# Patient Record
Sex: Female | Born: 1960 | Race: White | Hispanic: No | Marital: Married
Health system: Southern US, Community
[De-identification: ages and names within clinical notes are randomized; demographics above are authoritative.]

## PROBLEM LIST (undated history)

## (undated) HISTORY — PX: BREAST EXCISIONAL BIOPSY: SUR124

---

## 2005-09-27 ENCOUNTER — Encounter: Admission: RE | Admit: 2005-09-27 | Discharge: 2005-09-27 | Payer: Self-pay | Admitting: Obstetrics and Gynecology

## 2006-07-29 ENCOUNTER — Encounter: Admission: RE | Admit: 2006-07-29 | Discharge: 2006-07-29 | Payer: Self-pay | Admitting: Internal Medicine

## 2006-10-29 ENCOUNTER — Encounter: Admission: RE | Admit: 2006-10-29 | Discharge: 2006-10-29 | Payer: Self-pay | Admitting: Obstetrics and Gynecology

## 2007-12-07 ENCOUNTER — Ambulatory Visit: Payer: Self-pay | Admitting: Pulmonary Disease

## 2007-12-07 DIAGNOSIS — I10 Essential (primary) hypertension: Secondary | ICD-10-CM | POA: Insufficient documentation

## 2007-12-07 DIAGNOSIS — J45909 Unspecified asthma, uncomplicated: Secondary | ICD-10-CM | POA: Insufficient documentation

## 2007-12-07 DIAGNOSIS — K219 Gastro-esophageal reflux disease without esophagitis: Secondary | ICD-10-CM | POA: Insufficient documentation

## 2007-12-07 DIAGNOSIS — J309 Allergic rhinitis, unspecified: Secondary | ICD-10-CM | POA: Insufficient documentation

## 2008-01-19 ENCOUNTER — Encounter: Admission: RE | Admit: 2008-01-19 | Discharge: 2008-01-19 | Payer: Self-pay | Admitting: Obstetrics and Gynecology

## 2008-04-11 ENCOUNTER — Encounter: Admission: RE | Admit: 2008-04-11 | Discharge: 2008-04-11 | Payer: Self-pay | Admitting: Obstetrics and Gynecology

## 2008-04-11 ENCOUNTER — Encounter (INDEPENDENT_AMBULATORY_CARE_PROVIDER_SITE_OTHER): Payer: Self-pay | Admitting: Diagnostic Radiology

## 2008-05-03 ENCOUNTER — Encounter: Admission: RE | Admit: 2008-05-03 | Discharge: 2008-05-03 | Payer: Self-pay | Admitting: Surgery

## 2008-05-03 ENCOUNTER — Ambulatory Visit (HOSPITAL_BASED_OUTPATIENT_CLINIC_OR_DEPARTMENT_OTHER): Admission: RE | Admit: 2008-05-03 | Discharge: 2008-05-03 | Payer: Self-pay | Admitting: Surgery

## 2008-05-03 ENCOUNTER — Encounter (INDEPENDENT_AMBULATORY_CARE_PROVIDER_SITE_OTHER): Payer: Self-pay | Admitting: Surgery

## 2010-04-03 IMAGING — MG MM DIAGNOSTIC UNILATERAL R
9 of 10 series · 9 of 10 positions shown · non-contrast
Comparison: With priors

CLINICAL DATA: Palpable mass, right breast

DIGITAL DIAGNOSTIC  RIGHT  MAMMOGRAM  ] AND RIGHT BREAST
ULTRASOUND:

[R CC (1 of 3)]
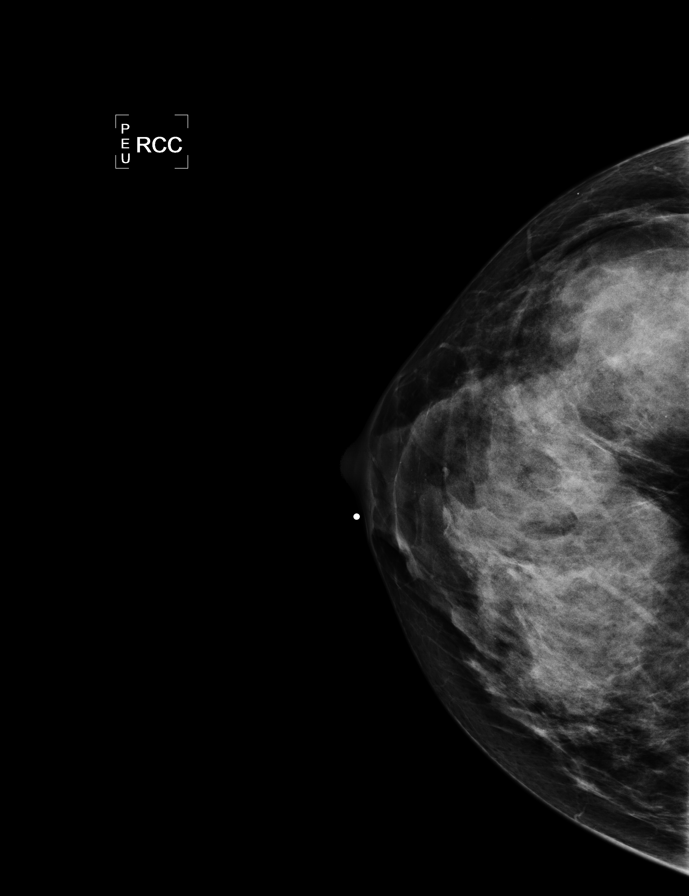

[R CC (2 of 3)]
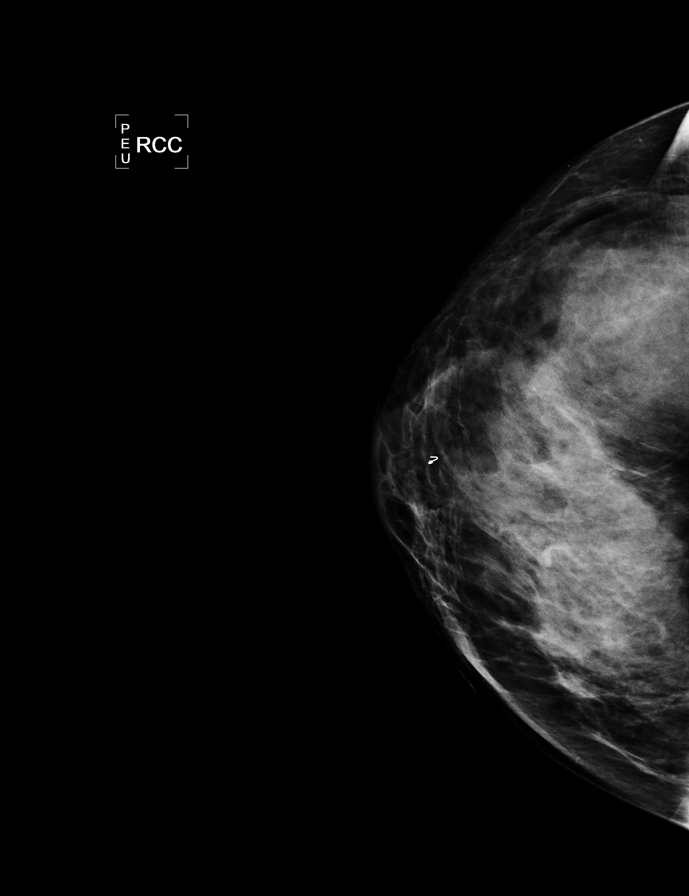

[R MLO]
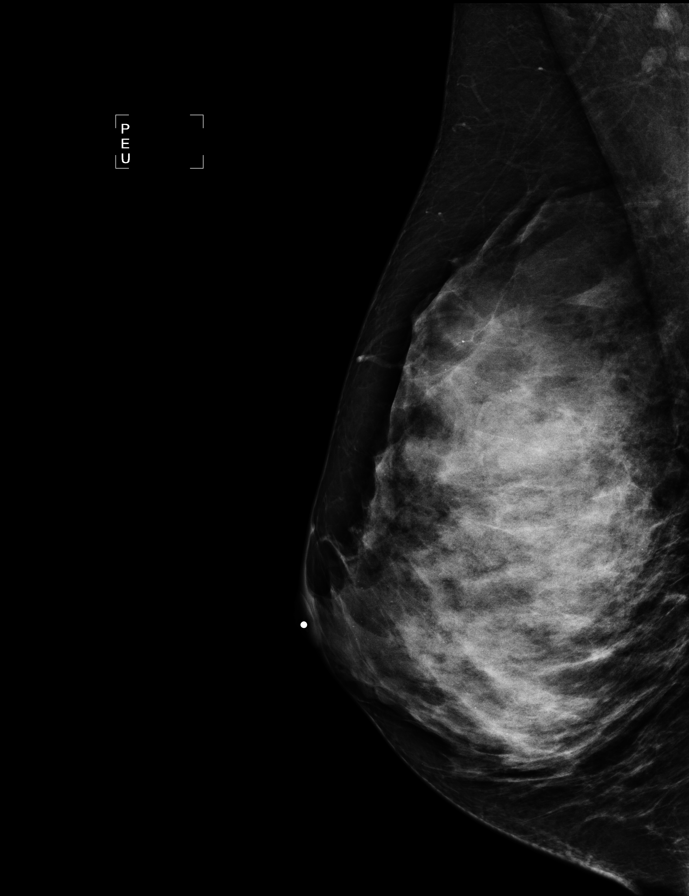

[R TAN]
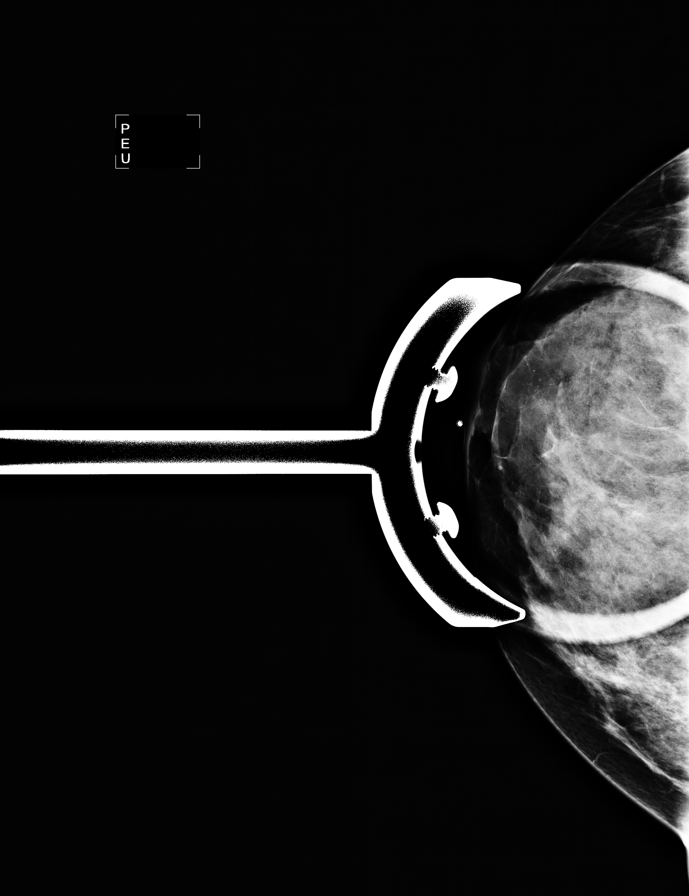

[R ML (1 of 4)]
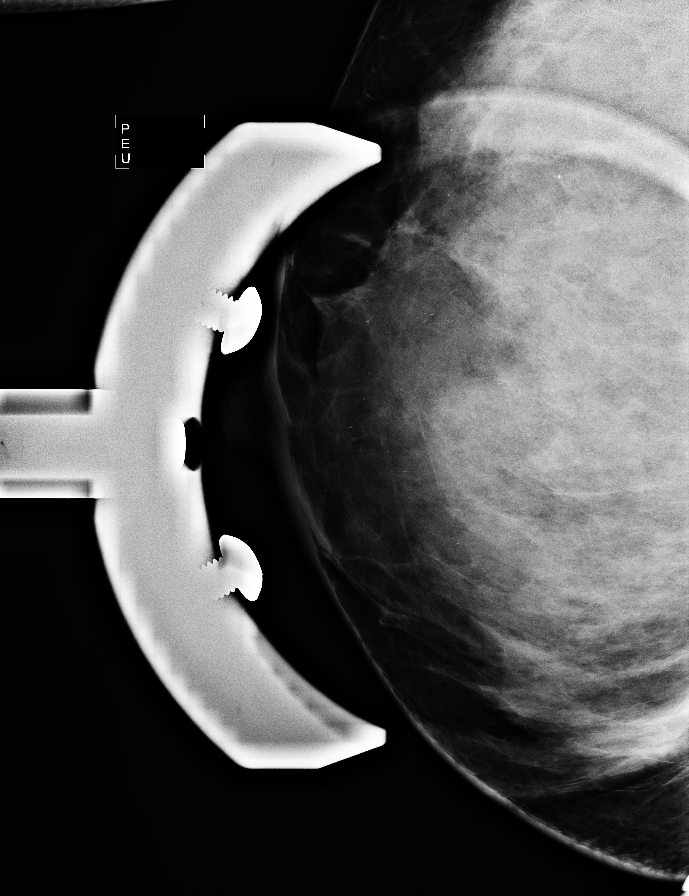

[R ML (2 of 4)]
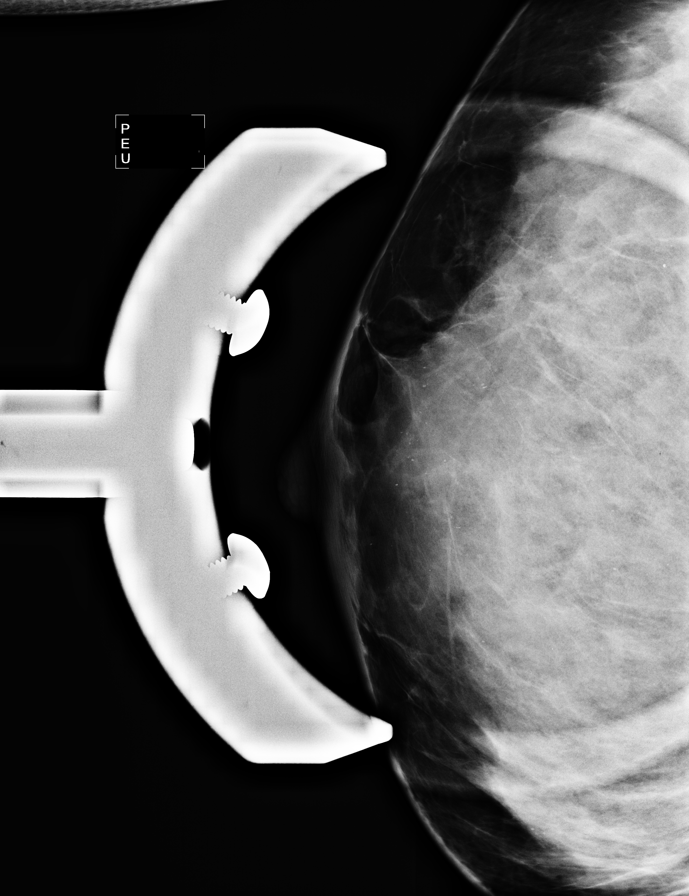

[R CC (3 of 3)]
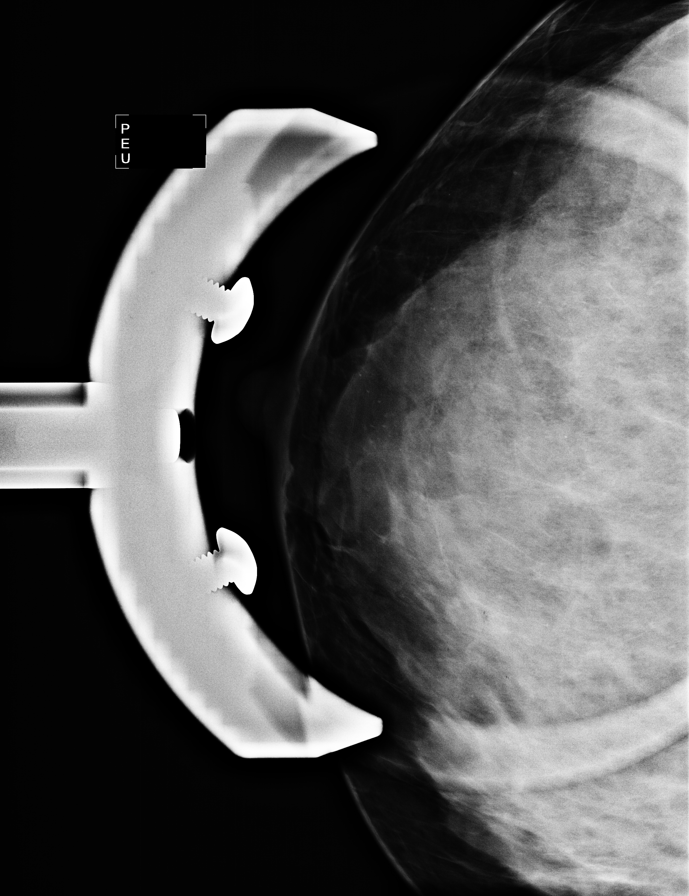

[R ML (3 of 4)]
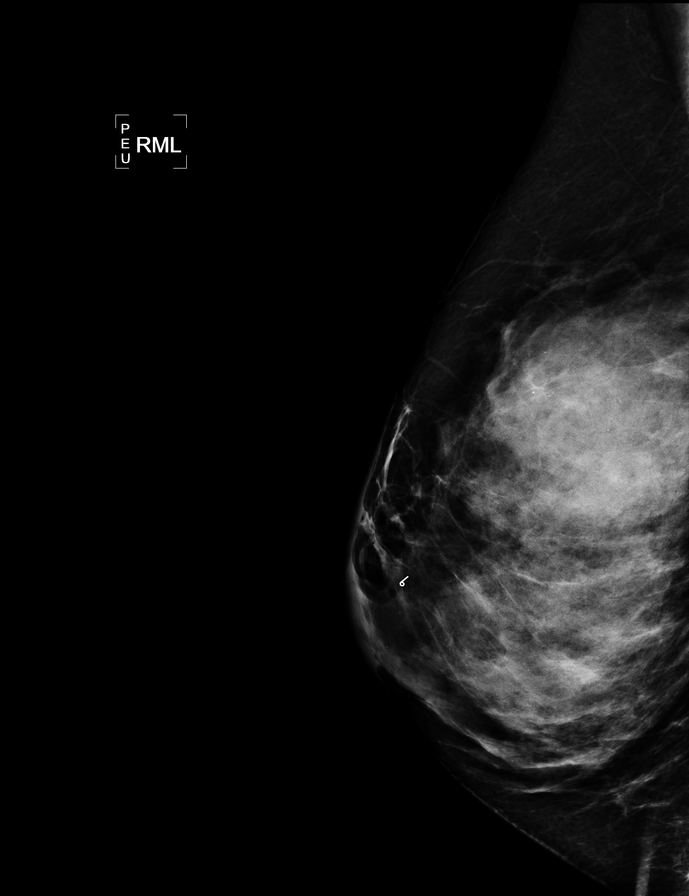

[R ML (4 of 4)]
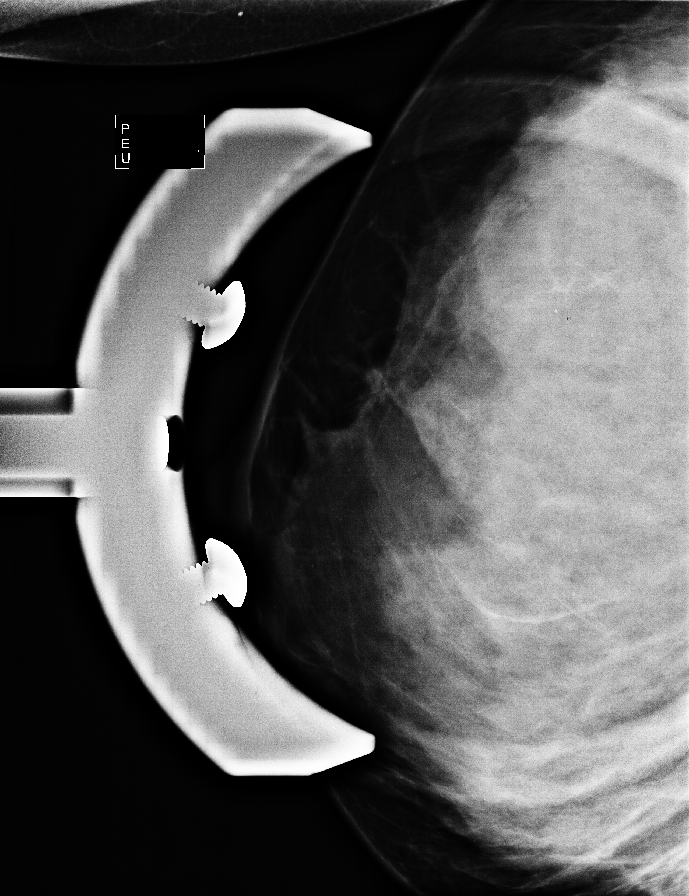

[9 of 10 positions shown; findings below may reference images not displayed]

FINDINGS: There is a dense fibroglandular pattern.  Scattered
diffuse punctate calcifications are again seen in the right breast.
Some of these calcifications are in the subareolar region and have
a differential appearance.  There is no mass seen mammographically.

On physical exam, I palpate a discrete mass in the right breast at
12 o'clock in the subareolar location.

Ultrasound is performed, showing there is a hypoechoic mass with
macrolobulated borders and what appears to be some extension into
ducts measuring 1.7 x 1.1 x 1.5 cm.  Sonographic evaluation of the
right axilla fails to reveal any enlarged adenopathy.
IMPRESSION: Suspicious mass, right breast.  Tissue sampling is recommended.  An
ultrasound-guided core biopsy will be performed and dictated
separately. Findings were discussed with the patient called to Dr.
[REDACTED].

BI-RADS CATEGORY 4:  Suspicious abnormality - biopsy should be
considered.

## 2010-04-17 ENCOUNTER — Encounter: Admission: RE | Admit: 2010-04-17 | Discharge: 2010-04-17 | Payer: Self-pay | Admitting: Obstetrics and Gynecology

## 2010-10-30 NOTE — Op Note (Signed)
NAME:  Alyssa Jennings, Alyssa Jennings             ACCOUNT NO.:  [id number]   MEDICAL RECORD NO.:  [id number]          PATIENT TYPE:  AMB   LOCATION:  [facility]SC                          FACILITY:  [facility]   PHYSICIAN:  Thomas A. Cornett, M.D.DATE OF BIRTH:  04/06/1961   DATE OF PROCEDURE:  05/03/2008  DATE OF DISCHARGE:                               OPERATIVE REPORT   PREOPERATIVE DIAGNOSIS:  Right breast papilloma.   POSTOPERATIVE DIAGNOSIS:  Right breast papilloma.   PROCEDURE:  Right breast needle-localized excisional biopsy.   SURGEON:  Maisie Fus A. Cornett, M.D.   ANESTHESIA:  LMA 0.25% Sensorcaine local.   ESTIMATED BLOOD LOSS:  20 mL.   SPECIMENS:  Right breast tissue with localizing wire and clip, see  pathology.   INDICATIONS FOR PROCEDURE:  The patient is a 50 year old female, found  to have an abnormality noted on recent mammography.  Core biopsy showed  a papilloma, but excision is recommended for definitive diagnosis.  She  presents today for needle-localized biopsy of this area.   DESCRIPTION OF PROCEDURE:  The patient was brought to the operating room  after undergoing right breast needle localization at the [facility]  of [place].  After induction of LMA anesthesia, the right breast was  prepped and draped in a sterile fashion.  Anesthesia was made from about  12 to 2 o'clock along the nipple-areolar complex on the right breast.  The wire exited here.  All tissue around the tip of the wire was  excised.  Radiograph revealed the wire to be intact along with the clip  to be present.  We irrigated the wound.  We achieved hemostasis with  cautery and then closed the wound in layers with a deep layer of 3-0  Vicryl and a subsequent 4-0 Monocryl stitch.  Dermabond was applied as  dressing.  All final counts of sponge, needle, and instruments were  found be correct at this portion of the case.  The patient was awoke and  taken to recovery in satisfactory condition.      Thomas A.  Cornett, M.D.  Electronically Signed     TAC/MEDQ  D:  05/03/2008  T:  05/04/2008  Job:  [id number]

## 2011-03-19 ENCOUNTER — Other Ambulatory Visit: Payer: Self-pay | Admitting: Obstetrics and Gynecology

## 2011-03-19 DIAGNOSIS — Z1231 Encounter for screening mammogram for malignant neoplasm of breast: Secondary | ICD-10-CM

## 2011-03-19 LAB — CBC
HCT: 41.1
MCV: 93.2
RBC: 4.41
WBC: 8.5

## 2011-03-19 LAB — DIFFERENTIAL
Eosinophils Absolute: 0.2
Eosinophils Relative: 3
Lymphocytes Relative: 24
Lymphs Abs: 2.1
Monocytes Relative: 5

## 2011-03-19 LAB — BASIC METABOLIC PANEL
Chloride: 106
GFR calc Af Amer: >60
GFR calc non Af Amer: >60
Potassium: 4.7
Sodium: 141

## 2011-04-30 ENCOUNTER — Ambulatory Visit: Payer: Self-pay

## 2011-05-14 ENCOUNTER — Ambulatory Visit
Admission: RE | Admit: 2011-05-14 | Discharge: 2011-05-14 | Disposition: A | Discharge status: Home / Self Care | Payer: Managed Care, Other (non HMO) | Source: Ambulatory Visit | Attending: Obstetrics and Gynecology | Admitting: Obstetrics and Gynecology

## 2011-05-14 DIAGNOSIS — Z1231 Encounter for screening mammogram for malignant neoplasm of breast: Secondary | ICD-10-CM

## 2011-11-01 ENCOUNTER — Other Ambulatory Visit: Payer: Self-pay | Admitting: Dermatology

## 2012-04-24 ENCOUNTER — Other Ambulatory Visit: Payer: Self-pay | Admitting: Obstetrics and Gynecology

## 2012-04-24 DIAGNOSIS — Z1231 Encounter for screening mammogram for malignant neoplasm of breast: Secondary | ICD-10-CM

## 2012-06-03 ENCOUNTER — Ambulatory Visit
Admission: RE | Admit: 2012-06-03 | Discharge: 2012-06-03 | Disposition: A | Discharge status: Home / Self Care | Payer: Managed Care, Other (non HMO) | Source: Ambulatory Visit | Attending: Obstetrics and Gynecology | Admitting: Obstetrics and Gynecology

## 2012-06-03 DIAGNOSIS — Z1231 Encounter for screening mammogram for malignant neoplasm of breast: Secondary | ICD-10-CM

## 2012-09-14 ENCOUNTER — Encounter: Payer: Self-pay | Admitting: Internal Medicine

## 2012-11-13 ENCOUNTER — Encounter: Payer: Managed Care, Other (non HMO) | Admitting: Internal Medicine

## 2013-03-04 ENCOUNTER — Other Ambulatory Visit: Payer: Self-pay | Admitting: Dermatology

## 2013-04-13 ENCOUNTER — Ambulatory Visit
Admission: RE | Admit: 2013-04-13 | Discharge: 2013-04-13 | Disposition: A | Discharge status: Home / Self Care | Payer: Managed Care, Other (non HMO) | Source: Ambulatory Visit | Attending: Internal Medicine | Admitting: Internal Medicine

## 2013-04-13 ENCOUNTER — Other Ambulatory Visit: Payer: Self-pay | Admitting: Internal Medicine

## 2013-04-13 DIAGNOSIS — R591 Generalized enlarged lymph nodes: Secondary | ICD-10-CM

## 2013-04-13 DIAGNOSIS — R1909 Other intra-abdominal and pelvic swelling, mass and lump: Secondary | ICD-10-CM

## 2013-04-13 MED ORDER — IOHEXOL 300 MG/ML  SOLN
100.0000 mL | Freq: Once | INTRAMUSCULAR | Status: AC | PRN
  Administered 2013-04-13: 100 mL via INTRAVENOUS

## 2013-09-13 ENCOUNTER — Other Ambulatory Visit: Payer: Self-pay

## 2013-09-13 DIAGNOSIS — Z1231 Encounter for screening mammogram for malignant neoplasm of breast: Secondary | ICD-10-CM

## 2013-10-06 ENCOUNTER — Ambulatory Visit
Admission: RE | Admit: 2013-10-06 | Discharge: 2013-10-06 | Disposition: A | Discharge status: Home / Self Care | Payer: Self-pay | Source: Ambulatory Visit

## 2013-10-06 ENCOUNTER — Encounter (INDEPENDENT_AMBULATORY_CARE_PROVIDER_SITE_OTHER): Payer: Self-pay

## 2013-10-06 DIAGNOSIS — Z1231 Encounter for screening mammogram for malignant neoplasm of breast: Secondary | ICD-10-CM

## 2013-10-08 ENCOUNTER — Other Ambulatory Visit: Payer: Self-pay | Admitting: Obstetrics and Gynecology

## 2013-10-08 DIAGNOSIS — R928 Other abnormal and inconclusive findings on diagnostic imaging of breast: Secondary | ICD-10-CM

## 2013-10-20 ENCOUNTER — Ambulatory Visit
Admission: RE | Admit: 2013-10-20 | Discharge: 2013-10-20 | Disposition: A | Discharge status: Home / Self Care | Payer: Managed Care, Other (non HMO) | Source: Ambulatory Visit | Attending: Obstetrics and Gynecology | Admitting: Obstetrics and Gynecology

## 2013-10-20 DIAGNOSIS — R928 Other abnormal and inconclusive findings on diagnostic imaging of breast: Secondary | ICD-10-CM

## 2014-10-06 ENCOUNTER — Other Ambulatory Visit: Payer: Self-pay

## 2014-10-06 DIAGNOSIS — Z1231 Encounter for screening mammogram for malignant neoplasm of breast: Secondary | ICD-10-CM

## 2014-10-31 ENCOUNTER — Encounter (INDEPENDENT_AMBULATORY_CARE_PROVIDER_SITE_OTHER): Payer: Self-pay

## 2014-10-31 ENCOUNTER — Ambulatory Visit
Admission: RE | Admit: 2014-10-31 | Discharge: 2014-10-31 | Disposition: A | Discharge status: Home / Self Care | Payer: Managed Care, Other (non HMO) | Source: Ambulatory Visit

## 2014-10-31 DIAGNOSIS — Z1231 Encounter for screening mammogram for malignant neoplasm of breast: Secondary | ICD-10-CM

## 2014-12-21 ENCOUNTER — Other Ambulatory Visit: Payer: Self-pay | Admitting: Internal Medicine

## 2014-12-21 DIAGNOSIS — R319 Hematuria, unspecified: Secondary | ICD-10-CM

## 2014-12-22 ENCOUNTER — Ambulatory Visit
Admission: RE | Admit: 2014-12-22 | Discharge: 2014-12-22 | Disposition: A | Discharge status: Home / Self Care | Payer: Managed Care, Other (non HMO) | Source: Ambulatory Visit | Attending: Internal Medicine | Admitting: Internal Medicine

## 2014-12-22 DIAGNOSIS — R319 Hematuria, unspecified: Secondary | ICD-10-CM

## 2016-06-27 ENCOUNTER — Other Ambulatory Visit: Payer: Self-pay | Admitting: Obstetrics and Gynecology

## 2016-06-27 ENCOUNTER — Other Ambulatory Visit: Payer: Self-pay | Admitting: Internal Medicine

## 2016-06-27 DIAGNOSIS — Z1231 Encounter for screening mammogram for malignant neoplasm of breast: Secondary | ICD-10-CM

## 2016-06-28 ENCOUNTER — Ambulatory Visit
Admission: RE | Admit: 2016-06-28 | Discharge: 2016-06-28 | Disposition: A | Discharge status: Home / Self Care | Payer: 59 | Source: Ambulatory Visit | Attending: Internal Medicine | Admitting: Internal Medicine

## 2016-06-28 DIAGNOSIS — Z1231 Encounter for screening mammogram for malignant neoplasm of breast: Secondary | ICD-10-CM

## 2017-06-26 ENCOUNTER — Other Ambulatory Visit: Payer: Self-pay | Admitting: Internal Medicine

## 2017-06-26 DIAGNOSIS — Z1231 Encounter for screening mammogram for malignant neoplasm of breast: Secondary | ICD-10-CM

## 2017-07-16 ENCOUNTER — Ambulatory Visit
Admission: RE | Admit: 2017-07-16 | Discharge: 2017-07-16 | Disposition: A | Discharge status: Home / Self Care | Payer: 59 | Source: Ambulatory Visit | Attending: Internal Medicine | Admitting: Internal Medicine

## 2017-07-16 DIAGNOSIS — Z1231 Encounter for screening mammogram for malignant neoplasm of breast: Secondary | ICD-10-CM

## 2017-12-25 DIAGNOSIS — I10 Essential (primary) hypertension: Secondary | ICD-10-CM | POA: Diagnosis not present

## 2017-12-25 DIAGNOSIS — R82998 Other abnormal findings in urine: Secondary | ICD-10-CM | POA: Diagnosis not present

## 2017-12-25 DIAGNOSIS — Z0000 Encounter for general adult medical examination without abnormal findings: Secondary | ICD-10-CM | POA: Diagnosis not present

## 2018-01-01 DIAGNOSIS — H6123 Impacted cerumen, bilateral: Secondary | ICD-10-CM | POA: Diagnosis not present

## 2018-01-01 DIAGNOSIS — I10 Essential (primary) hypertension: Secondary | ICD-10-CM | POA: Diagnosis not present

## 2018-01-01 DIAGNOSIS — Z6827 Body mass index (BMI) 27.0-27.9, adult: Secondary | ICD-10-CM | POA: Diagnosis not present

## 2018-01-01 DIAGNOSIS — Z0000 Encounter for general adult medical examination without abnormal findings: Secondary | ICD-10-CM | POA: Diagnosis not present

## 2018-01-02 DIAGNOSIS — Z1212 Encounter for screening for malignant neoplasm of rectum: Secondary | ICD-10-CM | POA: Diagnosis not present

## 2018-02-06 DIAGNOSIS — K5229 Other allergic and dietetic gastroenteritis and colitis: Secondary | ICD-10-CM | POA: Diagnosis not present

## 2018-02-06 DIAGNOSIS — Z91018 Allergy to other foods: Secondary | ICD-10-CM | POA: Diagnosis not present

## 2018-02-06 DIAGNOSIS — J3089 Other allergic rhinitis: Secondary | ICD-10-CM | POA: Diagnosis not present

## 2018-02-06 DIAGNOSIS — K200 Eosinophilic esophagitis: Secondary | ICD-10-CM | POA: Diagnosis not present

## 2018-04-16 DIAGNOSIS — J4520 Mild intermittent asthma, uncomplicated: Secondary | ICD-10-CM | POA: Diagnosis not present

## 2018-04-16 DIAGNOSIS — J3089 Other allergic rhinitis: Secondary | ICD-10-CM | POA: Diagnosis not present

## 2018-04-16 DIAGNOSIS — J301 Allergic rhinitis due to pollen: Secondary | ICD-10-CM | POA: Diagnosis not present

## 2018-08-25 ENCOUNTER — Other Ambulatory Visit: Payer: Self-pay | Admitting: Internal Medicine

## 2018-08-25 DIAGNOSIS — Z1231 Encounter for screening mammogram for malignant neoplasm of breast: Secondary | ICD-10-CM

## 2018-09-18 ENCOUNTER — Ambulatory Visit: Payer: 59

## 2018-11-03 DIAGNOSIS — L738 Other specified follicular disorders: Secondary | ICD-10-CM | POA: Diagnosis not present

## 2018-11-03 DIAGNOSIS — Z85828 Personal history of other malignant neoplasm of skin: Secondary | ICD-10-CM | POA: Diagnosis not present

## 2018-11-03 DIAGNOSIS — L821 Other seborrheic keratosis: Secondary | ICD-10-CM | POA: Diagnosis not present

## 2018-11-03 DIAGNOSIS — D225 Melanocytic nevi of trunk: Secondary | ICD-10-CM | POA: Diagnosis not present

## 2018-11-17 DIAGNOSIS — Z20828 Contact with and (suspected) exposure to other viral communicable diseases: Secondary | ICD-10-CM | POA: Diagnosis not present

## 2018-11-17 DIAGNOSIS — U071 COVID-19: Secondary | ICD-10-CM | POA: Diagnosis not present

## 2018-11-23 ENCOUNTER — Ambulatory Visit: Payer: 59

## 2018-12-08 DIAGNOSIS — Z20828 Contact with and (suspected) exposure to other viral communicable diseases: Secondary | ICD-10-CM | POA: Diagnosis not present

## 2018-12-16 DIAGNOSIS — Z20828 Contact with and (suspected) exposure to other viral communicable diseases: Secondary | ICD-10-CM | POA: Diagnosis not present

## 2018-12-23 DIAGNOSIS — H6121 Impacted cerumen, right ear: Secondary | ICD-10-CM | POA: Diagnosis not present

## 2018-12-23 DIAGNOSIS — H6122 Impacted cerumen, left ear: Secondary | ICD-10-CM | POA: Diagnosis not present

## 2018-12-23 DIAGNOSIS — H60393 Other infective otitis externa, bilateral: Secondary | ICD-10-CM | POA: Diagnosis not present

## 2019-01-18 DIAGNOSIS — Z0000 Encounter for general adult medical examination without abnormal findings: Secondary | ICD-10-CM | POA: Diagnosis not present

## 2019-01-18 DIAGNOSIS — E7849 Other hyperlipidemia: Secondary | ICD-10-CM | POA: Diagnosis not present

## 2019-01-18 DIAGNOSIS — I10 Essential (primary) hypertension: Secondary | ICD-10-CM | POA: Diagnosis not present

## 2019-01-18 DIAGNOSIS — R82998 Other abnormal findings in urine: Secondary | ICD-10-CM | POA: Diagnosis not present

## 2019-01-25 DIAGNOSIS — K200 Eosinophilic esophagitis: Secondary | ICD-10-CM | POA: Diagnosis not present

## 2019-01-25 DIAGNOSIS — Z20818 Contact with and (suspected) exposure to other bacterial communicable diseases: Secondary | ICD-10-CM | POA: Diagnosis not present

## 2019-01-25 DIAGNOSIS — E785 Hyperlipidemia, unspecified: Secondary | ICD-10-CM | POA: Diagnosis not present

## 2019-01-25 DIAGNOSIS — Z0000 Encounter for general adult medical examination without abnormal findings: Secondary | ICD-10-CM | POA: Diagnosis not present

## 2019-01-25 DIAGNOSIS — K219 Gastro-esophageal reflux disease without esophagitis: Secondary | ICD-10-CM | POA: Diagnosis not present

## 2019-01-25 DIAGNOSIS — Z1331 Encounter for screening for depression: Secondary | ICD-10-CM | POA: Diagnosis not present

## 2019-01-25 DIAGNOSIS — N182 Chronic kidney disease, stage 2 (mild): Secondary | ICD-10-CM | POA: Diagnosis not present

## 2019-02-11 ENCOUNTER — Other Ambulatory Visit: Payer: Self-pay

## 2019-02-11 ENCOUNTER — Ambulatory Visit
Admission: RE | Admit: 2019-02-11 | Discharge: 2019-02-11 | Disposition: A | Discharge status: Home / Self Care | Payer: BC Managed Care – PPO | Source: Ambulatory Visit | Attending: Internal Medicine | Admitting: Internal Medicine

## 2019-02-11 DIAGNOSIS — Z1231 Encounter for screening mammogram for malignant neoplasm of breast: Secondary | ICD-10-CM

## 2019-02-12 DIAGNOSIS — J3089 Other allergic rhinitis: Secondary | ICD-10-CM | POA: Diagnosis not present

## 2019-02-12 DIAGNOSIS — J301 Allergic rhinitis due to pollen: Secondary | ICD-10-CM | POA: Diagnosis not present

## 2019-02-12 DIAGNOSIS — K200 Eosinophilic esophagitis: Secondary | ICD-10-CM | POA: Diagnosis not present

## 2019-03-19 DIAGNOSIS — J301 Allergic rhinitis due to pollen: Secondary | ICD-10-CM | POA: Diagnosis not present

## 2019-03-19 DIAGNOSIS — J3089 Other allergic rhinitis: Secondary | ICD-10-CM | POA: Diagnosis not present

## 2019-03-19 DIAGNOSIS — K200 Eosinophilic esophagitis: Secondary | ICD-10-CM | POA: Diagnosis not present

## 2019-03-22 DIAGNOSIS — J301 Allergic rhinitis due to pollen: Secondary | ICD-10-CM | POA: Diagnosis not present

## 2019-03-22 DIAGNOSIS — J3089 Other allergic rhinitis: Secondary | ICD-10-CM | POA: Diagnosis not present

## 2019-10-13 DIAGNOSIS — J301 Allergic rhinitis due to pollen: Secondary | ICD-10-CM | POA: Diagnosis not present

## 2019-10-13 DIAGNOSIS — J3089 Other allergic rhinitis: Secondary | ICD-10-CM | POA: Diagnosis not present

## 2019-12-27 DIAGNOSIS — D225 Melanocytic nevi of trunk: Secondary | ICD-10-CM | POA: Diagnosis not present

## 2019-12-27 DIAGNOSIS — Z85828 Personal history of other malignant neoplasm of skin: Secondary | ICD-10-CM | POA: Diagnosis not present

## 2019-12-27 DIAGNOSIS — D235 Other benign neoplasm of skin of trunk: Secondary | ICD-10-CM | POA: Diagnosis not present

## 2019-12-27 DIAGNOSIS — D2272 Melanocytic nevi of left lower limb, including hip: Secondary | ICD-10-CM | POA: Diagnosis not present

## 2020-01-10 ENCOUNTER — Other Ambulatory Visit: Payer: Self-pay | Admitting: Internal Medicine

## 2020-01-10 DIAGNOSIS — Z1231 Encounter for screening mammogram for malignant neoplasm of breast: Secondary | ICD-10-CM

## 2020-02-08 DIAGNOSIS — Z0000 Encounter for general adult medical examination without abnormal findings: Secondary | ICD-10-CM | POA: Diagnosis not present

## 2020-02-08 DIAGNOSIS — E785 Hyperlipidemia, unspecified: Secondary | ICD-10-CM | POA: Diagnosis not present

## 2020-02-15 DIAGNOSIS — Z1212 Encounter for screening for malignant neoplasm of rectum: Secondary | ICD-10-CM | POA: Diagnosis not present

## 2020-02-15 DIAGNOSIS — E785 Hyperlipidemia, unspecified: Secondary | ICD-10-CM | POA: Diagnosis not present

## 2020-02-15 DIAGNOSIS — Z0000 Encounter for general adult medical examination without abnormal findings: Secondary | ICD-10-CM | POA: Diagnosis not present

## 2020-02-15 DIAGNOSIS — R82998 Other abnormal findings in urine: Secondary | ICD-10-CM | POA: Diagnosis not present

## 2020-02-15 DIAGNOSIS — I10 Essential (primary) hypertension: Secondary | ICD-10-CM | POA: Diagnosis not present

## 2020-02-23 DIAGNOSIS — M25512 Pain in left shoulder: Secondary | ICD-10-CM | POA: Diagnosis not present

## 2020-02-23 DIAGNOSIS — H6123 Impacted cerumen, bilateral: Secondary | ICD-10-CM | POA: Diagnosis not present

## 2020-02-29 ENCOUNTER — Other Ambulatory Visit: Payer: Self-pay

## 2020-02-29 ENCOUNTER — Ambulatory Visit
Admission: RE | Admit: 2020-02-29 | Discharge: 2020-02-29 | Disposition: A | Discharge status: Home / Self Care | Payer: BC Managed Care – PPO | Source: Ambulatory Visit | Attending: Internal Medicine | Admitting: Internal Medicine

## 2020-02-29 DIAGNOSIS — Z1231 Encounter for screening mammogram for malignant neoplasm of breast: Secondary | ICD-10-CM

## 2020-03-03 DIAGNOSIS — M25512 Pain in left shoulder: Secondary | ICD-10-CM | POA: Diagnosis not present

## 2020-04-07 DIAGNOSIS — K200 Eosinophilic esophagitis: Secondary | ICD-10-CM | POA: Diagnosis not present

## 2020-04-07 DIAGNOSIS — J3089 Other allergic rhinitis: Secondary | ICD-10-CM | POA: Diagnosis not present

## 2020-04-07 DIAGNOSIS — J301 Allergic rhinitis due to pollen: Secondary | ICD-10-CM | POA: Diagnosis not present

## 2020-04-12 DIAGNOSIS — J3089 Other allergic rhinitis: Secondary | ICD-10-CM | POA: Diagnosis not present

## 2020-04-12 DIAGNOSIS — J301 Allergic rhinitis due to pollen: Secondary | ICD-10-CM | POA: Diagnosis not present

## 2020-12-26 DIAGNOSIS — D2262 Melanocytic nevi of left upper limb, including shoulder: Secondary | ICD-10-CM | POA: Diagnosis not present

## 2020-12-26 DIAGNOSIS — D225 Melanocytic nevi of trunk: Secondary | ICD-10-CM | POA: Diagnosis not present

## 2020-12-26 DIAGNOSIS — D2371 Other benign neoplasm of skin of right lower limb, including hip: Secondary | ICD-10-CM | POA: Diagnosis not present

## 2020-12-26 DIAGNOSIS — Z85828 Personal history of other malignant neoplasm of skin: Secondary | ICD-10-CM | POA: Diagnosis not present

## 2021-02-20 DIAGNOSIS — E785 Hyperlipidemia, unspecified: Secondary | ICD-10-CM | POA: Diagnosis not present

## 2021-02-26 DIAGNOSIS — E785 Hyperlipidemia, unspecified: Secondary | ICD-10-CM | POA: Diagnosis not present

## 2021-02-26 DIAGNOSIS — Z1331 Encounter for screening for depression: Secondary | ICD-10-CM | POA: Diagnosis not present

## 2021-02-26 DIAGNOSIS — R82998 Other abnormal findings in urine: Secondary | ICD-10-CM | POA: Diagnosis not present

## 2021-02-26 DIAGNOSIS — Z0000 Encounter for general adult medical examination without abnormal findings: Secondary | ICD-10-CM | POA: Diagnosis not present

## 2021-03-21 ENCOUNTER — Other Ambulatory Visit: Payer: Self-pay | Admitting: Internal Medicine

## 2021-03-21 DIAGNOSIS — Z1231 Encounter for screening mammogram for malignant neoplasm of breast: Secondary | ICD-10-CM

## 2021-03-29 DIAGNOSIS — N959 Unspecified menopausal and perimenopausal disorder: Secondary | ICD-10-CM | POA: Diagnosis not present

## 2021-04-19 ENCOUNTER — Other Ambulatory Visit: Payer: Self-pay

## 2021-04-19 ENCOUNTER — Ambulatory Visit
Admission: RE | Admit: 2021-04-19 | Discharge: 2021-04-19 | Disposition: A | Discharge status: Home / Self Care | Payer: BC Managed Care – PPO | Source: Ambulatory Visit | Attending: Internal Medicine | Admitting: Internal Medicine

## 2021-04-19 DIAGNOSIS — Z1231 Encounter for screening mammogram for malignant neoplasm of breast: Secondary | ICD-10-CM | POA: Diagnosis not present

## 2021-12-28 DIAGNOSIS — Z6823 Body mass index (BMI) 23.0-23.9, adult: Secondary | ICD-10-CM | POA: Diagnosis not present

## 2021-12-28 DIAGNOSIS — H6123 Impacted cerumen, bilateral: Secondary | ICD-10-CM | POA: Diagnosis not present

## 2022-02-25 DIAGNOSIS — L821 Other seborrheic keratosis: Secondary | ICD-10-CM | POA: Diagnosis not present

## 2022-02-25 DIAGNOSIS — D2272 Melanocytic nevi of left lower limb, including hip: Secondary | ICD-10-CM | POA: Diagnosis not present

## 2022-02-25 DIAGNOSIS — D2271 Melanocytic nevi of right lower limb, including hip: Secondary | ICD-10-CM | POA: Diagnosis not present

## 2022-02-25 DIAGNOSIS — Z85828 Personal history of other malignant neoplasm of skin: Secondary | ICD-10-CM | POA: Diagnosis not present

## 2022-03-06 DIAGNOSIS — E785 Hyperlipidemia, unspecified: Secondary | ICD-10-CM | POA: Diagnosis not present

## 2022-03-13 DIAGNOSIS — Z0000 Encounter for general adult medical examination without abnormal findings: Secondary | ICD-10-CM | POA: Diagnosis not present

## 2022-03-13 DIAGNOSIS — I10 Essential (primary) hypertension: Secondary | ICD-10-CM | POA: Diagnosis not present

## 2022-03-13 DIAGNOSIS — I129 Hypertensive chronic kidney disease with stage 1 through stage 4 chronic kidney disease, or unspecified chronic kidney disease: Secondary | ICD-10-CM | POA: Diagnosis not present

## 2022-03-13 DIAGNOSIS — Z1339 Encounter for screening examination for other mental health and behavioral disorders: Secondary | ICD-10-CM | POA: Diagnosis not present

## 2022-03-13 DIAGNOSIS — Z1331 Encounter for screening for depression: Secondary | ICD-10-CM | POA: Diagnosis not present

## 2022-03-13 DIAGNOSIS — Z23 Encounter for immunization: Secondary | ICD-10-CM | POA: Diagnosis not present

## 2022-03-13 DIAGNOSIS — R82998 Other abnormal findings in urine: Secondary | ICD-10-CM | POA: Diagnosis not present

## 2022-03-13 DIAGNOSIS — N182 Chronic kidney disease, stage 2 (mild): Secondary | ICD-10-CM | POA: Diagnosis not present

## 2022-04-05 ENCOUNTER — Other Ambulatory Visit: Payer: Self-pay | Admitting: Internal Medicine

## 2022-04-05 DIAGNOSIS — Z1231 Encounter for screening mammogram for malignant neoplasm of breast: Secondary | ICD-10-CM

## 2022-05-27 ENCOUNTER — Ambulatory Visit
Admission: RE | Admit: 2022-05-27 | Discharge: 2022-05-27 | Disposition: A | Discharge status: Home / Self Care | Payer: BC Managed Care – PPO | Source: Ambulatory Visit | Attending: Internal Medicine | Admitting: Internal Medicine

## 2022-05-27 DIAGNOSIS — Z1231 Encounter for screening mammogram for malignant neoplasm of breast: Secondary | ICD-10-CM

## 2022-06-04 DIAGNOSIS — E785 Hyperlipidemia, unspecified: Secondary | ICD-10-CM | POA: Diagnosis not present

## 2023-04-02 DIAGNOSIS — I10 Essential (primary) hypertension: Secondary | ICD-10-CM | POA: Diagnosis not present

## 2023-04-02 DIAGNOSIS — E785 Hyperlipidemia, unspecified: Secondary | ICD-10-CM | POA: Diagnosis not present

## 2023-04-02 DIAGNOSIS — Z1389 Encounter for screening for other disorder: Secondary | ICD-10-CM | POA: Diagnosis not present

## 2023-04-02 DIAGNOSIS — R82998 Other abnormal findings in urine: Secondary | ICD-10-CM | POA: Diagnosis not present

## 2023-04-03 DIAGNOSIS — Z23 Encounter for immunization: Secondary | ICD-10-CM | POA: Diagnosis not present

## 2023-04-03 DIAGNOSIS — Z1339 Encounter for screening examination for other mental health and behavioral disorders: Secondary | ICD-10-CM | POA: Diagnosis not present

## 2023-04-03 DIAGNOSIS — I129 Hypertensive chronic kidney disease with stage 1 through stage 4 chronic kidney disease, or unspecified chronic kidney disease: Secondary | ICD-10-CM | POA: Diagnosis not present

## 2023-04-03 DIAGNOSIS — Z1331 Encounter for screening for depression: Secondary | ICD-10-CM | POA: Diagnosis not present

## 2023-04-03 DIAGNOSIS — Z0000 Encounter for general adult medical examination without abnormal findings: Secondary | ICD-10-CM | POA: Diagnosis not present

## 2023-05-06 DIAGNOSIS — Z6824 Body mass index (BMI) 24.0-24.9, adult: Secondary | ICD-10-CM | POA: Diagnosis not present

## 2023-05-06 DIAGNOSIS — H6123 Impacted cerumen, bilateral: Secondary | ICD-10-CM | POA: Diagnosis not present

## 2023-05-06 DIAGNOSIS — Z23 Encounter for immunization: Secondary | ICD-10-CM | POA: Diagnosis not present

## 2023-05-28 DIAGNOSIS — L821 Other seborrheic keratosis: Secondary | ICD-10-CM | POA: Diagnosis not present

## 2023-05-28 DIAGNOSIS — D2272 Melanocytic nevi of left lower limb, including hip: Secondary | ICD-10-CM | POA: Diagnosis not present

## 2023-05-28 DIAGNOSIS — Z85828 Personal history of other malignant neoplasm of skin: Secondary | ICD-10-CM | POA: Diagnosis not present

## 2023-05-28 DIAGNOSIS — L853 Xerosis cutis: Secondary | ICD-10-CM | POA: Diagnosis not present

## 2023-08-05 ENCOUNTER — Other Ambulatory Visit: Payer: Self-pay | Admitting: Internal Medicine

## 2023-08-05 ENCOUNTER — Encounter: Payer: Self-pay | Admitting: Internal Medicine

## 2023-08-05 DIAGNOSIS — Z1231 Encounter for screening mammogram for malignant neoplasm of breast: Secondary | ICD-10-CM

## 2023-08-14 ENCOUNTER — Ambulatory Visit
Admission: RE | Admit: 2023-08-14 | Discharge: 2023-08-14 | Disposition: A | Discharge status: Home / Self Care | Payer: BC Managed Care – PPO | Source: Ambulatory Visit | Attending: Internal Medicine | Admitting: Internal Medicine

## 2023-08-14 DIAGNOSIS — Z1231 Encounter for screening mammogram for malignant neoplasm of breast: Secondary | ICD-10-CM

## 2023-08-27 DIAGNOSIS — K200 Eosinophilic esophagitis: Secondary | ICD-10-CM | POA: Diagnosis not present

## 2023-08-27 DIAGNOSIS — Z1211 Encounter for screening for malignant neoplasm of colon: Secondary | ICD-10-CM | POA: Diagnosis not present

## 2023-09-02 DIAGNOSIS — K2289 Other specified disease of esophagus: Secondary | ICD-10-CM | POA: Diagnosis not present

## 2023-09-02 DIAGNOSIS — K640 First degree hemorrhoids: Secondary | ICD-10-CM | POA: Diagnosis not present

## 2023-09-02 DIAGNOSIS — K5730 Diverticulosis of large intestine without perforation or abscess without bleeding: Secondary | ICD-10-CM | POA: Diagnosis not present

## 2023-09-02 DIAGNOSIS — I10 Essential (primary) hypertension: Secondary | ICD-10-CM | POA: Diagnosis not present

## 2023-09-02 DIAGNOSIS — Z1211 Encounter for screening for malignant neoplasm of colon: Secondary | ICD-10-CM | POA: Diagnosis not present

## 2023-09-02 DIAGNOSIS — K449 Diaphragmatic hernia without obstruction or gangrene: Secondary | ICD-10-CM | POA: Diagnosis not present

## 2023-09-02 DIAGNOSIS — K3189 Other diseases of stomach and duodenum: Secondary | ICD-10-CM | POA: Diagnosis not present

## 2023-09-02 DIAGNOSIS — K209 Esophagitis, unspecified without bleeding: Secondary | ICD-10-CM | POA: Diagnosis not present

## 2023-09-02 DIAGNOSIS — K200 Eosinophilic esophagitis: Secondary | ICD-10-CM | POA: Diagnosis not present

## 2023-09-02 DIAGNOSIS — R1310 Dysphagia, unspecified: Secondary | ICD-10-CM | POA: Diagnosis not present

## 2023-09-02 DIAGNOSIS — K222 Esophageal obstruction: Secondary | ICD-10-CM | POA: Diagnosis not present

## 2023-10-14 DIAGNOSIS — K200 Eosinophilic esophagitis: Secondary | ICD-10-CM | POA: Diagnosis not present

## 2023-10-14 DIAGNOSIS — R1310 Dysphagia, unspecified: Secondary | ICD-10-CM | POA: Diagnosis not present

## 2024-04-23 DIAGNOSIS — I129 Hypertensive chronic kidney disease with stage 1 through stage 4 chronic kidney disease, or unspecified chronic kidney disease: Secondary | ICD-10-CM | POA: Diagnosis not present

## 2024-04-29 DIAGNOSIS — I10 Essential (primary) hypertension: Secondary | ICD-10-CM | POA: Diagnosis not present

## 2024-04-29 DIAGNOSIS — Z0000 Encounter for general adult medical examination without abnormal findings: Secondary | ICD-10-CM | POA: Diagnosis not present

## 2024-04-29 DIAGNOSIS — H6123 Impacted cerumen, bilateral: Secondary | ICD-10-CM | POA: Diagnosis not present

## 2024-04-29 DIAGNOSIS — Z1331 Encounter for screening for depression: Secondary | ICD-10-CM | POA: Diagnosis not present

## 2024-04-29 DIAGNOSIS — I129 Hypertensive chronic kidney disease with stage 1 through stage 4 chronic kidney disease, or unspecified chronic kidney disease: Secondary | ICD-10-CM | POA: Diagnosis not present

## 2024-04-29 DIAGNOSIS — Z1339 Encounter for screening examination for other mental health and behavioral disorders: Secondary | ICD-10-CM | POA: Diagnosis not present

## 2024-04-29 DIAGNOSIS — Z23 Encounter for immunization: Secondary | ICD-10-CM | POA: Diagnosis not present

## 2024-04-29 DIAGNOSIS — R82998 Other abnormal findings in urine: Secondary | ICD-10-CM | POA: Diagnosis not present
